# Patient Record
Sex: Female | Born: 2000 | Race: White | Hispanic: No | Marital: Single | Smoking: Current every day smoker
Health system: Southern US, Community
[De-identification: ages and names within clinical notes are randomized; demographics above are authoritative.]

## PROBLEM LIST (undated history)

## (undated) DIAGNOSIS — G43909 Migraine, unspecified, not intractable, without status migrainosus: Secondary | ICD-10-CM

---

## 2020-03-09 ENCOUNTER — Other Ambulatory Visit: Payer: Self-pay

## 2020-03-09 ENCOUNTER — Encounter (HOSPITAL_BASED_OUTPATIENT_CLINIC_OR_DEPARTMENT_OTHER): Payer: Self-pay

## 2020-03-09 ENCOUNTER — Emergency Department (HOSPITAL_BASED_OUTPATIENT_CLINIC_OR_DEPARTMENT_OTHER)
Admission: EM | Admit: 2020-03-09 | Discharge: 2020-03-10 | Disposition: A | Discharge status: Home / Self Care | Payer: No Typology Code available for payment source | Attending: Emergency Medicine | Admitting: Emergency Medicine

## 2020-03-09 ENCOUNTER — Emergency Department (HOSPITAL_BASED_OUTPATIENT_CLINIC_OR_DEPARTMENT_OTHER): Payer: No Typology Code available for payment source

## 2020-03-09 DIAGNOSIS — R002 Palpitations: Secondary | ICD-10-CM | POA: Diagnosis not present

## 2020-03-09 DIAGNOSIS — R0789 Other chest pain: Secondary | ICD-10-CM | POA: Diagnosis present

## 2020-03-09 DIAGNOSIS — Z8616 Personal history of COVID-19: Secondary | ICD-10-CM | POA: Diagnosis not present

## 2020-03-09 DIAGNOSIS — M791 Myalgia, unspecified site: Secondary | ICD-10-CM

## 2020-03-09 DIAGNOSIS — M79604 Pain in right leg: Secondary | ICD-10-CM | POA: Diagnosis not present

## 2020-03-09 DIAGNOSIS — M25512 Pain in left shoulder: Secondary | ICD-10-CM | POA: Diagnosis not present

## 2020-03-09 DIAGNOSIS — M79605 Pain in left leg: Secondary | ICD-10-CM | POA: Insufficient documentation

## 2020-03-09 DIAGNOSIS — F17290 Nicotine dependence, other tobacco product, uncomplicated: Secondary | ICD-10-CM | POA: Insufficient documentation

## 2020-03-09 LAB — CBC WITH DIFFERENTIAL/PLATELET
Abs Immature Granulocytes: 0.02 10*3/uL (ref 0.00–0.07)
Basophils Absolute: 0 10*3/uL (ref 0.0–0.1)
Basophils Relative: 0 %
Eosinophils Absolute: 0 10*3/uL (ref 0.0–0.5)
Eosinophils Relative: 0 %
HCT: 37.5 % (ref 36.0–46.0)
Hemoglobin: 12.6 g/dL (ref 12.0–15.0)
Immature Granulocytes: 0 %
Lymphocytes Relative: 18 %
Lymphs Abs: 1.6 10*3/uL (ref 0.7–4.0)
MCH: 29.7 pg (ref 26.0–34.0)
MCHC: 33.6 g/dL (ref 30.0–36.0)
MCV: 88.4 fL (ref 80.0–100.0)
Monocytes Absolute: 1.2 10*3/uL — ABNORMAL HIGH (ref 0.1–1.0)
Monocytes Relative: 13 %
Neutro Abs: 6.1 10*3/uL (ref 1.7–7.7)
Neutrophils Relative %: 69 %
Platelets: 329 10*3/uL (ref 150–400)
RBC: 4.24 MIL/uL (ref 3.87–5.11)
RDW: 12.4 % (ref 11.5–15.5)
WBC: 8.9 10*3/uL (ref 4.0–10.5)
nRBC: 0 % (ref 0.0–0.2)

## 2020-03-09 LAB — BASIC METABOLIC PANEL
Anion gap: 9 (ref 5–15)
BUN: 7 mg/dL (ref 6–20)
CO2: 27 mmol/L (ref 22–32)
Calcium: 9.7 mg/dL (ref 8.9–10.3)
Chloride: 103 mmol/L (ref 98–111)
Creatinine, Ser: 0.59 mg/dL (ref 0.44–1.00)
GFR, Estimated: >60 mL/min (ref >60)
Glucose, Bld: 91 mg/dL (ref 70–99)
Potassium: 3.6 mmol/L (ref 3.5–5.1)
Sodium: 139 mmol/L (ref 135–145)

## 2020-03-09 LAB — PREGNANCY, URINE: Preg Test, Ur: NEGATIVE

## 2020-03-09 MED ORDER — KETOROLAC TROMETHAMINE 15 MG/ML IJ SOLN
15.0000 mg | Freq: Once | INTRAMUSCULAR | Status: DC
  Filled 2020-03-09: qty 1

## 2020-03-09 MED ORDER — KETOROLAC TROMETHAMINE 15 MG/ML IJ SOLN
15.0000 mg | Freq: Once | INTRAMUSCULAR | Status: AC
  Administered 2020-03-09: 15 mg via INTRAVENOUS

## 2020-03-09 NOTE — ED Notes (Signed)
ED Provider at bedside. 

## 2020-03-09 NOTE — ED Notes (Signed)
Patient presented with PO Fluids as requested. Permission given by Wilkie Aye, MD.

## 2020-03-09 NOTE — ED Triage Notes (Addendum)
Pt c/o HA, body aches x today-c/o cough x 1 month-pain worse with deep breaths-tested +covid 1/24-NAD-steady gait

## 2020-03-10 LAB — D-DIMER, QUANTITATIVE: D-Dimer, Quant: <0.27 ug/mL-FEU (ref 0.00–0.50)

## 2020-03-10 NOTE — ED Provider Notes (Signed)
[facility] EMERGENCY DEPARTMENT Provider Note   CSN: [id number] Arrival date & time: 03/09/20  2224     History Chief Complaint  Patient presents with  . Generalized Body Aches    Kenedy Haisley is a 20 y.o. female.  HPI     This is a 20 year old female who presents with several complaints.  Patient reports that she had a significant amount of caffeine today including a highly caffeinated beverage from [employer] and a monster energy drink.  Since that time she has developed some left-sided chest and shoulder discomfort that is nonradiating.  She reports that it is pressure.  Rates her pain at 5 out of 10.  It is worse with deep breathing.  At one point she felt like she needed help holding of her left arm.  She describes associated palpitations.  No shortness of breath.  She also states that she had "achiness in my legs."  No recent fevers, chills, cough.  She was tested positive for COVID-19 on 1/28.    History reviewed. No pertinent past medical history.  There are no problems to display for this patient.   History reviewed. No pertinent surgical history.   OB History   No obstetric history on file.     No family history on file.  Social History   Tobacco Use  . Smoking status: Current Every Day Smoker    Types: E-cigarettes  . Smokeless tobacco: Never Used  Vaping Use  . Vaping Use: Every day  Substance Use Topics  . Alcohol use: Yes    Comment: weekly  . Drug use: Never    Home Medications Prior to Admission medications   Not on File    Allergies    Patient has no known allergies.  Review of Systems   Review of Systems  Constitutional: Negative for fever.  Respiratory: Negative for cough and shortness of breath.   Cardiovascular: Positive for chest pain and palpitations. Negative for leg swelling.  Gastrointestinal: Negative for abdominal pain, nausea and vomiting.  Neurological: Positive for headaches.  All other systems reviewed and are  negative.   Physical Exam Updated Vital Signs BP (!) 131/91   Pulse 64   Temp 98.4 F (36.9 C) (Oral)   Resp 16   Ht 1.778 m (5\' 10" )   Wt 54.4 kg   LMP 02/24/2020   SpO2 100%   BMI 17.22 kg/m   Physical Exam Vitals and nursing note reviewed.  Constitutional:      Appearance: She is well-developed and well-nourished. She is not ill-appearing.  HENT:     Head: Normocephalic and atraumatic.  Eyes:     Pupils: Pupils are equal, round, and reactive to light.  Cardiovascular:     Rate and Rhythm: Normal rate and regular rhythm.     Heart sounds: Normal heart sounds.  Pulmonary:     Effort: Pulmonary effort is normal. No respiratory distress.     Breath sounds: No wheezing.  Abdominal:     General: Bowel sounds are normal.     Palpations: Abdomen is soft.  Musculoskeletal:     Cervical back: Neck supple.     Right lower leg: No edema.     Left lower leg: No edema.  Skin:    General: Skin is warm and dry.  Neurological:     Mental Status: She is alert and oriented to person, place, and time.  Psychiatric:        Mood and Affect: Mood and affect and  mood normal.     ED Results / Procedures / Treatments   Labs (all labs ordered are listed, but only abnormal results are displayed) Labs Reviewed  CBC WITH DIFFERENTIAL/PLATELET - Abnormal; Notable for the following components:      Result Value   Monocytes Absolute 1.2 (*)    All other components within normal limits  BASIC METABOLIC PANEL  D-DIMER, QUANTITATIVE  PREGNANCY, URINE    EKG None ED ECG REPORT   Date: 03/10/2020  Rate: 78  Rhythm: normal sinus rhythm  QRS Axis: normal  Intervals: PR shortened  ST/T Wave abnormalities: normal  Conduction Disutrbances:none  Narrative Interpretation:   Old EKG Reviewed: none available  I have personally reviewed the EKG tracing and agree with the computerized printout as noted.  Radiology DG Chest Portable 1 View  Result Date: 03/09/2020 CLINICAL DATA:  Left  chest pain. Body aches. Cough. Positive COVID on 02/15/2020 EXAM: PORTABLE CHEST 1 VIEW COMPARISON:  None. FINDINGS: The heart size and mediastinal contours are within normal limits. Round density overlying the left lower lobe suggestive of a nipple shadow. Trace biapical pleural/pulmonary scarring. Large inspiratory effort. No focal consolidation. No pulmonary edema. No pleural effusion. No pneumothorax. No acute osseous abnormality. IMPRESSION: No active disease. Electronically Signed   By: Tish Frederickson M.D.   On: 03/09/2020 23:27    Procedures Procedures   Medications Ordered in ED Medications  ketorolac (TORADOL) 15 MG/ML injection 15 mg (15 mg Intravenous Given 03/09/20 2327)    ED Course  I have reviewed the triage vital signs and the nursing notes.  Pertinent labs & imaging results that were available during my care of the patient were reviewed by me and considered in my medical decision making (see chart for details).    MDM Rules/Calculators/A&P                          This is a 20 year old otherwise healthy female who presents with atypical chest pain and myalgias.  She is overall nontoxic and vital signs are reassuring.  Recently diagnosed with COVID-19.  She is very low risk for ACS.  EKG without acute ischemic changes.  Chest x-ray without evidence of pneumothorax or pneumonia.  Given recent COVID-19 diagnosis, will be at increased risk for hypercoagulable state.  Will screen with D-dimer for PE.  She is otherwise low risk.  D-dimer is negative.  Doubt PE.  Lab work including CBC and BMP reviewed.  No significant metabolic derangements.  Work-up is very reassuring.  Doubt acute emergent process  After history, exam, and medical workup I feel the patient has been appropriately medically screened and is safe for discharge home. Pertinent diagnoses were discussed with the patient. Patient was given return precautions.  Final Clinical Impression(s) / ED Diagnoses Final diagnoses:   Atypical chest pain  Myalgia    Rx / DC Orders ED Discharge Orders    None       Anika Shore, Mayer Masker, MD 03/10/20 0041

## 2020-03-10 NOTE — Discharge Instructions (Signed)
You were seen today for several complaints including muscle pain and chest pain.  Your work-up is reassuring.  There is not appear to be acute emergent process.  Take Tylenol or ibuprofen as needed for pain.  Avoid excessive caffeine use if you feel that this is related to your symptoms.

## 2020-12-25 ENCOUNTER — Encounter (HOSPITAL_BASED_OUTPATIENT_CLINIC_OR_DEPARTMENT_OTHER): Payer: Self-pay | Admitting: *Deleted

## 2020-12-25 ENCOUNTER — Emergency Department (HOSPITAL_BASED_OUTPATIENT_CLINIC_OR_DEPARTMENT_OTHER)
Admission: EM | Admit: 2020-12-25 | Discharge: 2020-12-25 | Disposition: A | Discharge status: Home / Self Care | Payer: No Typology Code available for payment source | Attending: Emergency Medicine | Admitting: Emergency Medicine

## 2020-12-25 ENCOUNTER — Other Ambulatory Visit: Payer: Self-pay

## 2020-12-25 DIAGNOSIS — J101 Influenza due to other identified influenza virus with other respiratory manifestations: Secondary | ICD-10-CM | POA: Insufficient documentation

## 2020-12-25 DIAGNOSIS — F17210 Nicotine dependence, cigarettes, uncomplicated: Secondary | ICD-10-CM | POA: Diagnosis not present

## 2020-12-25 DIAGNOSIS — R509 Fever, unspecified: Secondary | ICD-10-CM | POA: Diagnosis present

## 2020-12-25 DIAGNOSIS — J111 Influenza due to unidentified influenza virus with other respiratory manifestations: Secondary | ICD-10-CM

## 2020-12-25 DIAGNOSIS — Z20822 Contact with and (suspected) exposure to covid-19: Secondary | ICD-10-CM | POA: Diagnosis not present

## 2020-12-25 DIAGNOSIS — R110 Nausea: Secondary | ICD-10-CM

## 2020-12-25 LAB — CBC WITH DIFFERENTIAL/PLATELET
Abs Immature Granulocytes: 0.01 10*3/uL (ref 0.00–0.07)
Basophils Absolute: 0 10*3/uL (ref 0.0–0.1)
Basophils Relative: 1 %
Eosinophils Absolute: 0 10*3/uL (ref 0.0–0.5)
Eosinophils Relative: 0 %
HCT: 31.4 % — ABNORMAL LOW (ref 36.0–46.0)
Hemoglobin: 10.7 g/dL — ABNORMAL LOW (ref 12.0–15.0)
Immature Granulocytes: 1 %
Lymphocytes Relative: 16 %
Lymphs Abs: 0.3 10*3/uL — ABNORMAL LOW (ref 0.7–4.0)
MCH: 29.7 pg (ref 26.0–34.0)
MCHC: 34.1 g/dL (ref 30.0–36.0)
MCV: 87.2 fL (ref 80.0–100.0)
Monocytes Absolute: 0.5 10*3/uL (ref 0.1–1.0)
Monocytes Relative: 27 %
Neutro Abs: 1.1 10*3/uL — ABNORMAL LOW (ref 1.7–7.7)
Neutrophils Relative %: 55 %
Platelets: 176 10*3/uL (ref 150–400)
RBC: 3.6 MIL/uL — ABNORMAL LOW (ref 3.87–5.11)
RDW: 12.4 % (ref 11.5–15.5)
WBC: 2 10*3/uL — ABNORMAL LOW (ref 4.0–10.5)
nRBC: 0 % (ref 0.0–0.2)

## 2020-12-25 LAB — RESP PANEL BY RT-PCR (FLU A&B, COVID) ARPGX2
Influenza A by PCR: POSITIVE — AB
Influenza B by PCR: NEGATIVE
SARS Coronavirus 2 by RT PCR: NEGATIVE

## 2020-12-25 LAB — URINALYSIS, ROUTINE W REFLEX MICROSCOPIC
Bilirubin Urine: NEGATIVE
Glucose, UA: NEGATIVE mg/dL
Hgb urine dipstick: NEGATIVE
Ketones, ur: NEGATIVE mg/dL
Leukocytes,Ua: NEGATIVE
Nitrite: NEGATIVE
Protein, ur: NEGATIVE mg/dL
Specific Gravity, Urine: 1.01 (ref 1.005–1.030)
pH: 6.5 (ref 5.0–8.0)

## 2020-12-25 LAB — COMPREHENSIVE METABOLIC PANEL
ALT: 12 U/L (ref 0–44)
AST: 20 U/L (ref 15–41)
Albumin: 4 g/dL (ref 3.5–5.0)
Alkaline Phosphatase: 39 U/L (ref 38–126)
Anion gap: 10 (ref 5–15)
BUN: <5 mg/dL — ABNORMAL LOW (ref 6–20)
CO2: 23 mmol/L (ref 22–32)
Calcium: 8.8 mg/dL — ABNORMAL LOW (ref 8.9–10.3)
Chloride: 100 mmol/L (ref 98–111)
Creatinine, Ser: 0.53 mg/dL (ref 0.44–1.00)
GFR, Estimated: >60 mL/min (ref >60)
Glucose, Bld: 124 mg/dL — ABNORMAL HIGH (ref 70–99)
Potassium: 3.1 mmol/L — ABNORMAL LOW (ref 3.5–5.1)
Sodium: 133 mmol/L — ABNORMAL LOW (ref 135–145)
Total Bilirubin: 0.5 mg/dL (ref 0.3–1.2)
Total Protein: 6.5 g/dL (ref 6.5–8.1)

## 2020-12-25 LAB — PREGNANCY, URINE: Preg Test, Ur: NEGATIVE

## 2020-12-25 MED ORDER — KETOROLAC TROMETHAMINE 30 MG/ML IJ SOLN
30.0000 mg | Freq: Once | INTRAMUSCULAR | Status: AC
  Administered 2020-12-25: 18:00:00 30 mg via INTRAVENOUS
  Filled 2020-12-25: qty 1

## 2020-12-25 MED ORDER — LACTATED RINGERS IV BOLUS
1000.0000 mL | Freq: Once | INTRAVENOUS | Status: AC
  Administered 2020-12-25: 18:00:00 1000 mL via INTRAVENOUS

## 2020-12-25 MED ORDER — POTASSIUM CHLORIDE CRYS ER 20 MEQ PO TBCR
40.0000 meq | EXTENDED_RELEASE_TABLET | Freq: Once | ORAL | Status: AC
  Administered 2020-12-25: 20:00:00 40 meq via ORAL
  Filled 2020-12-25: qty 2

## 2020-12-25 MED ORDER — ONDANSETRON HCL 4 MG/2ML IJ SOLN
4.0000 mg | Freq: Once | INTRAMUSCULAR | Status: AC
  Administered 2020-12-25: 18:00:00 4 mg via INTRAVENOUS
  Filled 2020-12-25: qty 2

## 2020-12-25 MED ORDER — ACETAMINOPHEN 500 MG PO TABS
1000.0000 mg | ORAL_TABLET | Freq: Once | ORAL | Status: AC
  Administered 2020-12-25: 17:00:00 1000 mg via ORAL
  Filled 2020-12-25: qty 2

## 2020-12-25 MED ORDER — ONDANSETRON 4 MG PO TBDP
4.0000 mg | ORAL_TABLET | Freq: Once | ORAL | Status: AC
  Administered 2020-12-25: 17:00:00 4 mg via ORAL
  Filled 2020-12-25: qty 1

## 2020-12-25 MED ORDER — ONDANSETRON HCL 4 MG PO TABS: 4.0000 mg | ORAL_TABLET | Freq: Four times a day (QID) | ORAL | 0 refills | Status: AC

## 2020-12-25 NOTE — ED Provider Notes (Signed)
[facility] EMERGENCY DEPARTMENT Provider Note   CSN: [id number] Arrival date & time: 12/25/20  1651     History Chief Complaint  Patient presents with   Fever    Lori Wilkerson is a 20 y.o. female.  HPI     Chills, aches, nausea Can't eat or drink Severe aches and chills Haven't thrown up but having severe nausea so has not been eating or drinking starting around 5PM No diarrhea but has not had anything to eat Coughing a little bit but not too bad No shortness of breath Fatigue, lightheadedness when going from sitting to standing  No urinary symptoms, no preg symptoms Some abdominal pain but feels like hunger, not constant Headache on and off  No chest pain  No congestion or sore throat Had virtual visit and was diagnosed clinically with flu J and J in 2021, no flu shot  No other medical problems, no cig, vape for 2 yr, no other drugs Etoh on weekends 1-2    History reviewed. No pertinent past medical history.  There are no problems to display for this patient.   History reviewed. No pertinent surgical history.   OB History   No obstetric history on file.     History reviewed. No pertinent family history.  Social History   Tobacco Use   Smoking status: Every Day    Types: E-cigarettes   Smokeless tobacco: Never  Vaping Use   Vaping Use: Every day  Substance Use Topics   Alcohol use: Yes    Comment: weekly   Drug use: Never    Home Medications Prior to Admission medications   Medication Sig Start Date End Date Taking? Authorizing Provider  ondansetron (ZOFRAN) 4 MG tablet Take 1 tablet (4 mg total) by mouth every 6 (six) hours. 12/25/20  Yes Alvira Monday, MD    Allergies    Patient has no known allergies.  Review of Systems   Review of Systems  Constitutional:  Positive for appetite change, fatigue and fever.  HENT:  Negative for congestion and sore throat.   Eyes:  Negative for visual disturbance.  Respiratory:  Positive  for cough. Negative for shortness of breath.   Cardiovascular:  Negative for chest pain.  Gastrointestinal:  Positive for nausea and vomiting. Negative for abdominal pain and diarrhea.  Genitourinary:  Negative for difficulty urinating.  Musculoskeletal:  Positive for arthralgias and myalgias. Negative for back pain and neck pain.  Skin:  Negative for rash.  Neurological:  Positive for headaches. Negative for syncope.   Physical Exam Updated Vital Signs BP 121/63 (BP Location: Right Arm)   Pulse 73   Temp 98.5 F (36.9 C) (Oral)   Resp 16   Ht 5\' 10"  (1.778 m)   Wt 52.2 kg   LMP 12/13/2020   SpO2 98%   BMI 16.50 kg/m   Physical Exam Vitals and nursing note reviewed.  Constitutional:      General: She is not in acute distress.    Appearance: She is well-developed. She is not diaphoretic.  HENT:     Head: Normocephalic and atraumatic.  Eyes:     Conjunctiva/sclera: Conjunctivae normal.  Cardiovascular:     Rate and Rhythm: Regular rhythm. Tachycardia present.     Heart sounds: Normal heart sounds. No murmur heard.   No friction rub. No gallop.  Pulmonary:     Effort: Pulmonary effort is normal. No respiratory distress.     Breath sounds: Normal breath sounds. No wheezing or rales.  Abdominal:     General: There is no distension.     Palpations: Abdomen is soft.     Tenderness: There is no abdominal tenderness. There is no guarding.  Musculoskeletal:        General: No tenderness.     Cervical back: Normal range of motion.  Skin:    General: Skin is warm and dry.     Findings: No erythema or rash.  Neurological:     Mental Status: She is alert and oriented to person, place, and time.    ED Results / Procedures / Treatments   Labs (all labs ordered are listed, but only abnormal results are displayed) Labs Reviewed  RESP PANEL BY RT-PCR (FLU A&B, COVID) ARPGX2 - Abnormal; Notable for the following components:      Result Value   Influenza A by PCR POSITIVE (*)     All other components within normal limits  URINALYSIS, ROUTINE W REFLEX MICROSCOPIC - Abnormal; Notable for the following components:   Color, Urine STRAW (*)    All other components within normal limits  CBC WITH DIFFERENTIAL/PLATELET - Abnormal; Notable for the following components:   WBC 2.0 (*)    RBC 3.60 (*)    Hemoglobin 10.7 (*)    HCT 31.4 (*)    Neutro Abs 1.1 (*)    Lymphs Abs 0.3 (*)    All other components within normal limits  COMPREHENSIVE METABOLIC PANEL - Abnormal; Notable for the following components:   Sodium 133 (*)    Potassium 3.1 (*)    Glucose, Bld 124 (*)    BUN <5 (*)    Calcium 8.8 (*)    All other components within normal limits  PREGNANCY, URINE    EKG None  Radiology No results found.  Procedures Procedures   Medications Ordered in ED Medications  acetaminophen (TYLENOL) tablet 1,000 mg (1,000 mg Oral Given 12/25/20 1712)  ondansetron (ZOFRAN-ODT) disintegrating tablet 4 mg (4 mg Oral Given 12/25/20 1712)  lactated ringers bolus 1,000 mL (0 mLs Intravenous Stopped 12/25/20 1912)  ketorolac (TORADOL) 30 MG/ML injection 30 mg (30 mg Intravenous Given 12/25/20 1807)  ondansetron (ZOFRAN) injection 4 mg (4 mg Intravenous Given 12/25/20 1807)  potassium chloride SA (KLOR-CON M) CR tablet 40 mEq (40 mEq Oral Given 12/25/20 1948)    ED Course  I have reviewed the triage vital signs and the nursing notes.  Pertinent labs & imaging results that were available during my care of the patient were reviewed by me and considered in my medical decision making (see chart for details).    MDM Rules/Calculators/A&P                           20 year old female with no significant medical history presents with concern for fever, body aches, nausea and fatigue beginning yesterday.  She was diagnosed clinically with the flu and given a prescription for Tamiflu via telehealth.  Urine pregnancy negative.  Urinalysis shows no sign of UTI.  Benign abdominal exam and  doubt intra-abdominal etiology of symptoms.  Reports only slight cough, with normal lung exam and have low suspicion for bacterial pneumonia.  Given her inability to tolerate p.o. today, generalized weakness, lightheadedness, ordered labs, IV fluids, and IV Zofran.  Labs show leukopenia/ANA 1100, discussed with Dr. Alvy Bimler Hematology, likely viral supression/acute illness. Recommended recheck of labs with PCP, if no improvement in one month can follow up with hematology.  Mild hypokalemia-given dose of  K in ED.    COVID flu testing positive for influenza.  Feels improved with hydration, IV zofran. Given rx for zofran, recommend continued supportive care. She received tamilfu from urgent care.   Final Clinical Impression(s) / ED Diagnoses Final diagnoses:  Fever, unspecified fever cause  Nausea  Influenza    Rx / DC Orders ED Discharge Orders          Ordered    ondansetron (ZOFRAN) 4 MG tablet  Every 6 hours        12/25/20 1855             Gareth Morgan, MD 12/26/20 1414

## 2020-12-25 NOTE — ED Notes (Signed)
Patient discharged to home.  All discharge instructions reviewed.  Patient verbalized understanding via teachback method.  VS WDL.  Respirations even and unlabored.  Ambulatory out of ED.   °

## 2020-12-25 NOTE — ED Triage Notes (Addendum)
Fever, body aches, nausea, decreased appetite x 1 day. She took tamiflu earlier today- was dx with flu via virtual appt. States she has taken dramamine for nausea

## 2020-12-30 ENCOUNTER — Emergency Department (HOSPITAL_BASED_OUTPATIENT_CLINIC_OR_DEPARTMENT_OTHER)
Admission: EM | Admit: 2020-12-30 | Discharge: 2020-12-30 | Disposition: A | Discharge status: Home / Self Care | Payer: No Typology Code available for payment source | Attending: Student | Admitting: Student

## 2020-12-30 ENCOUNTER — Other Ambulatory Visit: Payer: Self-pay

## 2020-12-30 ENCOUNTER — Encounter (HOSPITAL_BASED_OUTPATIENT_CLINIC_OR_DEPARTMENT_OTHER): Payer: Self-pay

## 2020-12-30 DIAGNOSIS — R112 Nausea with vomiting, unspecified: Secondary | ICD-10-CM

## 2020-12-30 DIAGNOSIS — J111 Influenza due to unidentified influenza virus with other respiratory manifestations: Secondary | ICD-10-CM | POA: Insufficient documentation

## 2020-12-30 DIAGNOSIS — F17290 Nicotine dependence, other tobacco product, uncomplicated: Secondary | ICD-10-CM | POA: Diagnosis not present

## 2020-12-30 DIAGNOSIS — R519 Headache, unspecified: Secondary | ICD-10-CM | POA: Diagnosis present

## 2020-12-30 DIAGNOSIS — R7401 Elevation of levels of liver transaminase levels: Secondary | ICD-10-CM | POA: Insufficient documentation

## 2020-12-30 DIAGNOSIS — D72819 Decreased white blood cell count, unspecified: Secondary | ICD-10-CM | POA: Insufficient documentation

## 2020-12-30 LAB — CBC WITH DIFFERENTIAL/PLATELET
Abs Immature Granulocytes: 0 10*3/uL (ref 0.00–0.07)
Basophils Absolute: 0 10*3/uL (ref 0.0–0.1)
Basophils Relative: 0 %
Eosinophils Absolute: 0 10*3/uL (ref 0.0–0.5)
Eosinophils Relative: 0 %
HCT: 34.5 % — ABNORMAL LOW (ref 36.0–46.0)
Hemoglobin: 12.1 g/dL (ref 12.0–15.0)
Immature Granulocytes: 0 %
Lymphocytes Relative: 25 %
Lymphs Abs: 1.2 10*3/uL (ref 0.7–4.0)
MCH: 30.3 pg (ref 26.0–34.0)
MCHC: 35.1 g/dL (ref 30.0–36.0)
MCV: 86.5 fL (ref 80.0–100.0)
Monocytes Absolute: 0.5 10*3/uL (ref 0.1–1.0)
Monocytes Relative: 10 %
Neutro Abs: 3.1 10*3/uL (ref 1.7–7.7)
Neutrophils Relative %: 65 %
Platelets: 149 10*3/uL — ABNORMAL LOW (ref 150–400)
RBC: 3.99 MIL/uL (ref 3.87–5.11)
RDW: 12.3 % (ref 11.5–15.5)
WBC: 4.8 10*3/uL (ref 4.0–10.5)
nRBC: 0 % (ref 0.0–0.2)

## 2020-12-30 LAB — COMPREHENSIVE METABOLIC PANEL
ALT: 71 U/L — ABNORMAL HIGH (ref 0–44)
AST: 132 U/L — ABNORMAL HIGH (ref 15–41)
Albumin: 3.9 g/dL (ref 3.5–5.0)
Alkaline Phosphatase: 36 U/L — ABNORMAL LOW (ref 38–126)
Anion gap: 7 (ref 5–15)
BUN: 6 mg/dL (ref 6–20)
CO2: 27 mmol/L (ref 22–32)
Calcium: 8.6 mg/dL — ABNORMAL LOW (ref 8.9–10.3)
Chloride: 104 mmol/L (ref 98–111)
Creatinine, Ser: 0.49 mg/dL (ref 0.44–1.00)
GFR, Estimated: >60 mL/min (ref >60)
Glucose, Bld: 109 mg/dL — ABNORMAL HIGH (ref 70–99)
Potassium: 3.7 mmol/L (ref 3.5–5.1)
Sodium: 138 mmol/L (ref 135–145)
Total Bilirubin: 0.3 mg/dL (ref 0.3–1.2)
Total Protein: 6.7 g/dL (ref 6.5–8.1)

## 2020-12-30 MED ORDER — ONDANSETRON HCL 4 MG/2ML IJ SOLN: 4.0000 mg | Freq: Once | INTRAMUSCULAR | Status: DC

## 2020-12-30 MED ORDER — LACTATED RINGERS IV BOLUS
1000.0000 mL | Freq: Once | INTRAVENOUS | Status: AC
  Administered 2020-12-30: 1000 mL via INTRAVENOUS

## 2020-12-30 MED ORDER — DIPHENHYDRAMINE HCL 50 MG/ML IJ SOLN
25.0000 mg | Freq: Once | INTRAMUSCULAR | Status: AC
  Administered 2020-12-30: 25 mg via INTRAVENOUS
  Filled 2020-12-30: qty 1

## 2020-12-30 MED ORDER — PROCHLORPERAZINE EDISYLATE 10 MG/2ML IJ SOLN
10.0000 mg | Freq: Once | INTRAMUSCULAR | Status: AC
  Administered 2020-12-30: 10 mg via INTRAVENOUS
  Filled 2020-12-30: qty 2

## 2020-12-30 MED ORDER — KETOROLAC TROMETHAMINE 15 MG/ML IJ SOLN
15.0000 mg | Freq: Once | INTRAMUSCULAR | Status: AC
  Administered 2020-12-30: 15 mg via INTRAVENOUS
  Filled 2020-12-30: qty 1

## 2020-12-30 NOTE — ED Notes (Signed)
ED Provider at bedside. 

## 2020-12-30 NOTE — Discharge Instructions (Addendum)
You were seen today in the emergency department for evaluation of nausea vomiting and headache in the setting of a known influenza diagnosis..  You are given a nausea medication here in the emergency department that improved your symptoms and you are safe for discharge today.  Your blood work shows an improving white blood cell count that was previously low, your electrolytes are normal but you do have elevated liver function tests with an AST of 132, ALT 71.  These are likely elevated in the setting of your persistent vomiting and these usually improve on their own.  Please call your primary care physician at home to schedule follow-up blood work to ensure that these are improving.  At this time you are safe for discharge, but please return the emergency department if your vomiting is uncontrolled, you  had chest pain, shortness of breath, persistent fever or any other concerning symptoms.

## 2020-12-30 NOTE — ED Notes (Signed)
No acute distress noted upon this RN's departure of patient. Verified discharge paperwork with name and DOB. Vital signs stable. Patient taken to checkout window. Discharge paperwork discussed with patient. No further questions voiced upon discharge.  ° °

## 2020-12-30 NOTE — ED Provider Notes (Signed)
[facility] EMERGENCY DEPARTMENT Provider Note   CSN: [id number] Arrival date & time: 12/30/20  [id number]     History Chief Complaint  Patient presents with   Emesis    Lori Wilkerson is a 20 y.o. female who presents to the emergency department for evaluation of emesis and headache.  Patient was diagnosed with flu on 12/25/2020 and discharged with a prescription for Tamiflu.  She has since completed the Tamiflu but has had persistent emesis and arrives with a headache.  She was prescribed Zofran but has not taken this medication due to fears of QT prolongation interaction with her Zoloft.  Previous ECGs show no evidence of QT prolongation.  Denies abdominal pain, chest pain, shortness of breath or other systemic symptoms.  4 days ago, pregnancy test negative and patient has had no vaginal bleeding or discharge, no sexual activity since then.   Emesis Associated symptoms: headaches   Associated symptoms: no abdominal pain, no arthralgias, no chills, no cough, no fever and no sore throat       History reviewed. No pertinent past medical history.  There are no problems to display for this patient.   History reviewed. No pertinent surgical history.   OB History   No obstetric history on file.     No family history on file.  Social History   Tobacco Use   Smoking status: Every Day    Types: E-cigarettes   Smokeless tobacco: Never  Vaping Use   Vaping Use: Every day  Substance Use Topics   Alcohol use: Yes    Comment: weekly   Drug use: Yes    Types: Marijuana    Home Medications Prior to Admission medications   Medication Sig Start Date End Date Taking? Authorizing Provider  ondansetron (ZOFRAN) 4 MG tablet Take 1 tablet (4 mg total) by mouth every 6 (six) hours. 12/25/20   Gareth Morgan, MD    Allergies    Patient has no known allergies.  Review of Systems   Review of Systems  Constitutional:  Negative for chills and fever.  HENT:  Negative for ear  pain and sore throat.   Eyes:  Negative for pain and visual disturbance.  Respiratory:  Negative for cough and shortness of breath.   Cardiovascular:  Negative for chest pain and palpitations.  Gastrointestinal:  Positive for nausea and vomiting. Negative for abdominal pain.  Genitourinary:  Negative for dysuria and hematuria.  Musculoskeletal:  Negative for arthralgias and back pain.  Skin:  Negative for color change and rash.  Neurological:  Positive for headaches. Negative for seizures and syncope.  All other systems reviewed and are negative.  Physical Exam Updated Vital Signs BP 121/78   Pulse 74   Temp (!) 97.5 F (36.4 C) (Oral)   Resp 16   Ht 5\' 10"  (1.778 m)   Wt 52.2 kg   LMP 12/13/2020   SpO2 100%   BMI 16.50 kg/m   Physical Exam Vitals and nursing note reviewed.  Constitutional:      General: She is not in acute distress.    Appearance: She is well-developed.  HENT:     Head: Normocephalic and atraumatic.  Eyes:     Conjunctiva/sclera: Conjunctivae normal.  Cardiovascular:     Rate and Rhythm: Normal rate and regular rhythm.     Heart sounds: No murmur heard. Pulmonary:     Effort: Pulmonary effort is normal. No respiratory distress.     Breath sounds: Normal breath sounds.  Abdominal:  Palpations: Abdomen is soft.     Tenderness: There is no abdominal tenderness.  Musculoskeletal:        General: No swelling.     Cervical back: Neck supple.  Skin:    General: Skin is warm and dry.     Capillary Refill: Capillary refill takes less than 2 seconds.  Neurological:     Mental Status: She is alert.  Psychiatric:        Mood and Affect: Mood normal.    ED Results / Procedures / Treatments   Labs (all labs ordered are listed, but only abnormal results are displayed) Labs Reviewed  COMPREHENSIVE METABOLIC PANEL - Abnormal; Notable for the following components:      Result Value   Glucose, Bld 109 (*)    Calcium 8.6 (*)    AST 132 (*)    ALT 71  (*)    Alkaline Phosphatase 36 (*)    All other components within normal limits  CBC WITH DIFFERENTIAL/PLATELET - Abnormal; Notable for the following components:   HCT 34.5 (*)    Platelets 149 (*)    All other components within normal limits    EKG None  Radiology No results found.  Procedures Procedures   Medications Ordered in ED Medications  lactated ringers bolus 1,000 mL (1,000 mLs Intravenous New Bag/Given 12/30/20 0825)  prochlorperazine (COMPAZINE) injection 10 mg (10 mg Intravenous Given 12/30/20 0822)  diphenhydrAMINE (BENADRYL) injection 25 mg (25 mg Intravenous Given 12/30/20 0826)  ketorolac (TORADOL) 15 MG/ML injection 15 mg (15 mg Intravenous Given 12/30/20 0831)    ED Course  I have reviewed the triage vital signs and the nursing notes.  Pertinent labs & imaging results that were available during my care of the patient were reviewed by me and considered in my medical decision making (see chart for details).    MDM Rules/Calculators/A&P                           Patient seen emergency department for evaluation of the headache nausea and vomiting in the setting of a influenza infection.  Physical exam is unremarkable with a benign abdominal exam, cardiopulmonary exam unremarkable.  Laboratory evaluation with an improving white blood cell count at 4.8 up from leukopenia 2.05 days ago.  Chemistry unremarkable.  Patient does have new transaminitis to AST 132, ALT 71 but she has no right upper quadrant pain or postprandial symptoms.  Suspect persistent vomiting versus medication side effect from Tamiflu causing transient transaminitis.  Patient given headache cocktail and fluid resuscitation here in the emergency department and symptoms are much improved on reevaluation.  Patient is going home from college on Thursday and will set up an appointment with her PCP back home to follow-up on the transaminitis to ensure that this is resolving.  She was given strict return  precautions which she voiced understanding and she was discharged. Final Clinical Impression(s) / ED Diagnoses Final diagnoses:  Nausea and vomiting, unspecified vomiting type  Transaminitis  Influenza    Rx / DC Orders ED Discharge Orders     None        Pharaoh Pio, Wyn Forster, MD 12/30/20 [phone]

## 2020-12-30 NOTE — ED Triage Notes (Addendum)
Pt arrives to ED following recent Flu diagnosis on Sunday with reports of vomiting and headache starting this morning. Pt was given tamiflu which she finished last night and given Zofran that she did not take for concern of interaction with her Zoloft.  Pt also states she recently quit vapping and ' wondered if this was a bad nicotine headache'

## 2021-09-16 ENCOUNTER — Encounter (HOSPITAL_BASED_OUTPATIENT_CLINIC_OR_DEPARTMENT_OTHER): Payer: Self-pay | Admitting: Emergency Medicine

## 2021-09-16 ENCOUNTER — Emergency Department (HOSPITAL_BASED_OUTPATIENT_CLINIC_OR_DEPARTMENT_OTHER)
Admission: EM | Admit: 2021-09-16 | Discharge: 2021-09-17 | Disposition: A | Discharge status: Home / Self Care | Payer: No Typology Code available for payment source | Attending: Emergency Medicine | Admitting: Emergency Medicine

## 2021-09-16 DIAGNOSIS — R112 Nausea with vomiting, unspecified: Secondary | ICD-10-CM | POA: Diagnosis not present

## 2021-09-16 DIAGNOSIS — R519 Headache, unspecified: Secondary | ICD-10-CM

## 2021-09-16 DIAGNOSIS — F17210 Nicotine dependence, cigarettes, uncomplicated: Secondary | ICD-10-CM | POA: Insufficient documentation

## 2021-09-16 HISTORY — DX: Migraine, unspecified, not intractable, without status migrainosus: G43.909

## 2021-09-16 LAB — CBC WITH DIFFERENTIAL/PLATELET
Abs Immature Granulocytes: 0.02 10*3/uL (ref 0.00–0.07)
Basophils Absolute: 0 10*3/uL (ref 0.0–0.1)
Basophils Relative: 1 %
Eosinophils Absolute: 0 10*3/uL (ref 0.0–0.5)
Eosinophils Relative: 1 %
HCT: 35.7 % — ABNORMAL LOW (ref 36.0–46.0)
Hemoglobin: 12.2 g/dL (ref 12.0–15.0)
Immature Granulocytes: 0 %
Lymphocytes Relative: 20 %
Lymphs Abs: 1.5 10*3/uL (ref 0.7–4.0)
MCH: 30.3 pg (ref 26.0–34.0)
MCHC: 34.2 g/dL (ref 30.0–36.0)
MCV: 88.6 fL (ref 80.0–100.0)
Monocytes Absolute: 0.4 10*3/uL (ref 0.1–1.0)
Monocytes Relative: 6 %
Neutro Abs: 5.5 10*3/uL (ref 1.7–7.7)
Neutrophils Relative %: 72 %
Platelets: 238 10*3/uL (ref 150–400)
RBC: 4.03 MIL/uL (ref 3.87–5.11)
RDW: 12.3 % (ref 11.5–15.5)
WBC: 7.5 10*3/uL (ref 4.0–10.5)
nRBC: 0 % (ref 0.0–0.2)

## 2021-09-16 LAB — BASIC METABOLIC PANEL
Anion gap: 9 (ref 5–15)
BUN: 8 mg/dL (ref 6–20)
CO2: 24 mmol/L (ref 22–32)
Calcium: 8.7 mg/dL — ABNORMAL LOW (ref 8.9–10.3)
Chloride: 108 mmol/L (ref 98–111)
Creatinine, Ser: 0.63 mg/dL (ref 0.44–1.00)
GFR, Estimated: >60 mL/min (ref >60)
Glucose, Bld: 100 mg/dL — ABNORMAL HIGH (ref 70–99)
Potassium: 3.3 mmol/L — ABNORMAL LOW (ref 3.5–5.1)
Sodium: 141 mmol/L (ref 135–145)

## 2021-09-16 LAB — HCG, SERUM, QUALITATIVE: Preg, Serum: NEGATIVE

## 2021-09-16 MED ORDER — DIPHENHYDRAMINE HCL 50 MG/ML IJ SOLN
25.0000 mg | Freq: Once | INTRAMUSCULAR | Status: AC
  Administered 2021-09-16: 25 mg via INTRAVENOUS
  Filled 2021-09-16: qty 1

## 2021-09-16 MED ORDER — METOCLOPRAMIDE HCL 5 MG/ML IJ SOLN
10.0000 mg | Freq: Once | INTRAMUSCULAR | Status: AC
  Administered 2021-09-16: 10 mg via INTRAVENOUS
  Filled 2021-09-16: qty 2

## 2021-09-16 MED ORDER — SODIUM CHLORIDE 0.9 % IV BOLUS
1000.0000 mL | Freq: Once | INTRAVENOUS | Status: AC
  Administered 2021-09-16: 1000 mL via INTRAVENOUS

## 2021-09-16 MED ORDER — KETOROLAC TROMETHAMINE 15 MG/ML IJ SOLN
15.0000 mg | Freq: Once | INTRAMUSCULAR | Status: AC | PRN
  Administered 2021-09-16: 15 mg via INTRAVENOUS
  Filled 2021-09-16: qty 1

## 2021-09-16 NOTE — ED Provider Notes (Signed)
[facility] Provider Note: Lori Dell, MD, FACEP  CSN: [id number] MRN: [id number] ARRIVAL: 09/16/21 at 2151 ROOM: [facility]   CHIEF COMPLAINT  Headache and Vomiting   HISTORY OF PRESENT ILLNESS  09/16/21 10:51 PM Lori Wilkerson is a 21 y.o. female who was at a college event today which involved heavy alcohol consumption.  She had not had any alcohol for most of the summer.  This ended about 3 PM.  About 8 PM she developed a frontal headache which she rates as a 7 out of 10.  She states she does have a history of migraines.  The headache has been associated with nausea and vomiting and she has not been able to keep anything down.   Past Medical History:  Diagnosis Date   Migraine     History reviewed. No pertinent surgical history.  No family history on file.  Social History   Tobacco Use   Smoking status: Every Day    Types: E-cigarettes   Smokeless tobacco: Never  Vaping Use   Vaping Use: Every day  Substance Use Topics   Alcohol use: Yes    Comment: weekly   Drug use: Yes    Types: Marijuana    Prior to Admission medications   Medication Sig Start Date End Date Taking? Authorizing Provider  ondansetron (ZOFRAN) 4 MG tablet Take 1 tablet (4 mg total) by mouth every 6 (six) hours. 12/25/20   Alvira Monday, MD    Allergies Patient has no known allergies.   REVIEW OF SYSTEMS  Negative except as noted here or in the History of Present Illness.   PHYSICAL EXAMINATION  Initial Vital Signs Blood pressure (!) 129/98, pulse 95, temperature 98 F (36.7 C), temperature source Oral, resp. rate 20, height 5\' 10"  (1.778 m), weight 52.2 kg, last menstrual period 09/10/2021, SpO2 99 %.  Examination General: Well-developed, well-nourished female in no acute distress; appearance consistent with age of record HENT: normocephalic; atraumatic Eyes: pupils equal, round and reactive to light; extraocular muscles intact Neck: supple Heart: regular rate and  rhythm Lungs: clear to auscultation bilaterally Abdomen: soft; nondistended; nontender; bowel sounds present Extremities: No deformity; full range of motion; pulses normal Neurologic: Awake, alert and oriented; motor function intact in all extremities and symmetric; no facial droop Skin: Warm and dry Psychiatric: Normal mood and affect   RESULTS  Summary of this visit's results, reviewed and interpreted by myself:   EKG Interpretation  Date/Time:    Ventricular Rate:    PR Interval:    QRS Duration:   QT Interval:    QTC Calculation:   R Axis:     Text Interpretation:         Laboratory Studies: Results for orders placed or performed during the hospital encounter of 09/16/21 (from the past 24 hour(s))  CBC with Differential     Status: Abnormal   Collection Time: 09/16/21 11:11 PM  Result Value Ref Range   WBC 7.5 4.0 - 10.5 K/uL   RBC 4.03 3.87 - 5.11 MIL/uL   Hemoglobin 12.2 12.0 - 15.0 g/dL   HCT 09/18/21 (L) 09.3 - 81.8 %   MCV 88.6 80.0 - 100.0 fL   MCH 30.3 26.0 - 34.0 pg   MCHC 34.2 30.0 - 36.0 g/dL   RDW 29.9 37.1 - 69.6 %   Platelets 238 150 - 400 K/uL   nRBC 0.0 0.0 - 0.2 %   Neutrophils Relative % 72 %   Neutro Abs 5.5 1.7 - 7.7  K/uL   Lymphocytes Relative 20 %   Lymphs Abs 1.5 0.7 - 4.0 K/uL   Monocytes Relative 6 %   Monocytes Absolute 0.4 0.1 - 1.0 K/uL   Eosinophils Relative 1 %   Eosinophils Absolute 0.0 0.0 - 0.5 K/uL   Basophils Relative 1 %   Basophils Absolute 0.0 0.0 - 0.1 K/uL   Immature Granulocytes 0 %   Abs Immature Granulocytes 0.02 0.00 - 0.07 K/uL  Basic metabolic panel     Status: Abnormal   Collection Time: 09/16/21 11:11 PM  Result Value Ref Range   Sodium 141 135 - 145 mmol/L   Potassium 3.3 (L) 3.5 - 5.1 mmol/L   Chloride 108 98 - 111 mmol/L   CO2 24 22 - 32 mmol/L   Glucose, Bld 100 (H) 70 - 99 mg/dL   BUN 8 6 - 20 mg/dL   Creatinine, Ser 5.36 0.44 - 1.00 mg/dL   Calcium 8.7 (L) 8.9 - 10.3 mg/dL   GFR, Estimated >14 >43  mL/min   Anion gap 9 5 - 15  hCG, serum, qualitative     Status: None   Collection Time: 09/16/21 11:11 PM  Result Value Ref Range   Preg, Serum NEGATIVE NEGATIVE   Imaging Studies: No results found.  ED COURSE and MDM  Nursing notes, initial and subsequent vitals signs, including pulse oximetry, reviewed and interpreted by myself.  Vitals:   09/16/21 2217 09/16/21 2221 09/16/21 2345 09/17/21 0030  BP:  (!) 129/98 116/71 115/78  Pulse:  95 69 64  Resp:  20 20 15   Temp:  98 F (36.7 C)    TempSrc:  Oral    SpO2:  99% 100% 100%  Weight: 52.2 kg     Height: 5\' 10"  (1.778 m)      Medications  sodium chloride 0.9 % bolus 1,000 mL (0 mLs Intravenous Stopped 09/17/21 0019)  diphenhydrAMINE (BENADRYL) injection 25 mg (25 mg Intravenous Given 09/16/21 2316)  metoCLOPramide (REGLAN) injection 10 mg (10 mg Intravenous Given 09/16/21 2316)  ketorolac (TORADOL) 15 MG/ML injection 15 mg (15 mg Intravenous Given 09/16/21 2316)   1:07 AM Patient feeling better after IV medications and fluids.  States she is ready to go home.  She is drinking fluids without vomiting.  It is unclear if this was just a hangover or if the alcohol consumption triggered a migraine.  Either way she has improved with treatment.   PROCEDURES  Procedures   ED DIAGNOSES     ICD-10-CM   1. Bad headache  R51.9     2. Nausea and vomiting in adult  R11.2          [name], 09/18/21, MD 09/17/21 0110

## 2021-09-16 NOTE — ED Triage Notes (Signed)
Pt sts she was drinking earlier and now has HA and vomiting; unable to keep down liquids

## 2021-09-17 NOTE — ED Notes (Signed)
Pt tolerating PO intake at this time, EDP made aware.

## 2021-09-17 NOTE — ED Notes (Signed)
Pt reports improvement in sx. Pt provided apple juice and saltine crackers for PO challenge.

## 2022-06-09 IMAGING — DX DG CHEST 1V PORT
1 series · 1 of 1 positions shown · non-contrast
Comparison: None.

CLINICAL DATA: Left chest pain. Body aches. Cough. Positive COVID
on 02/15/2020

EXAM:
PORTABLE CHEST 1 VIEW

[chest ap]
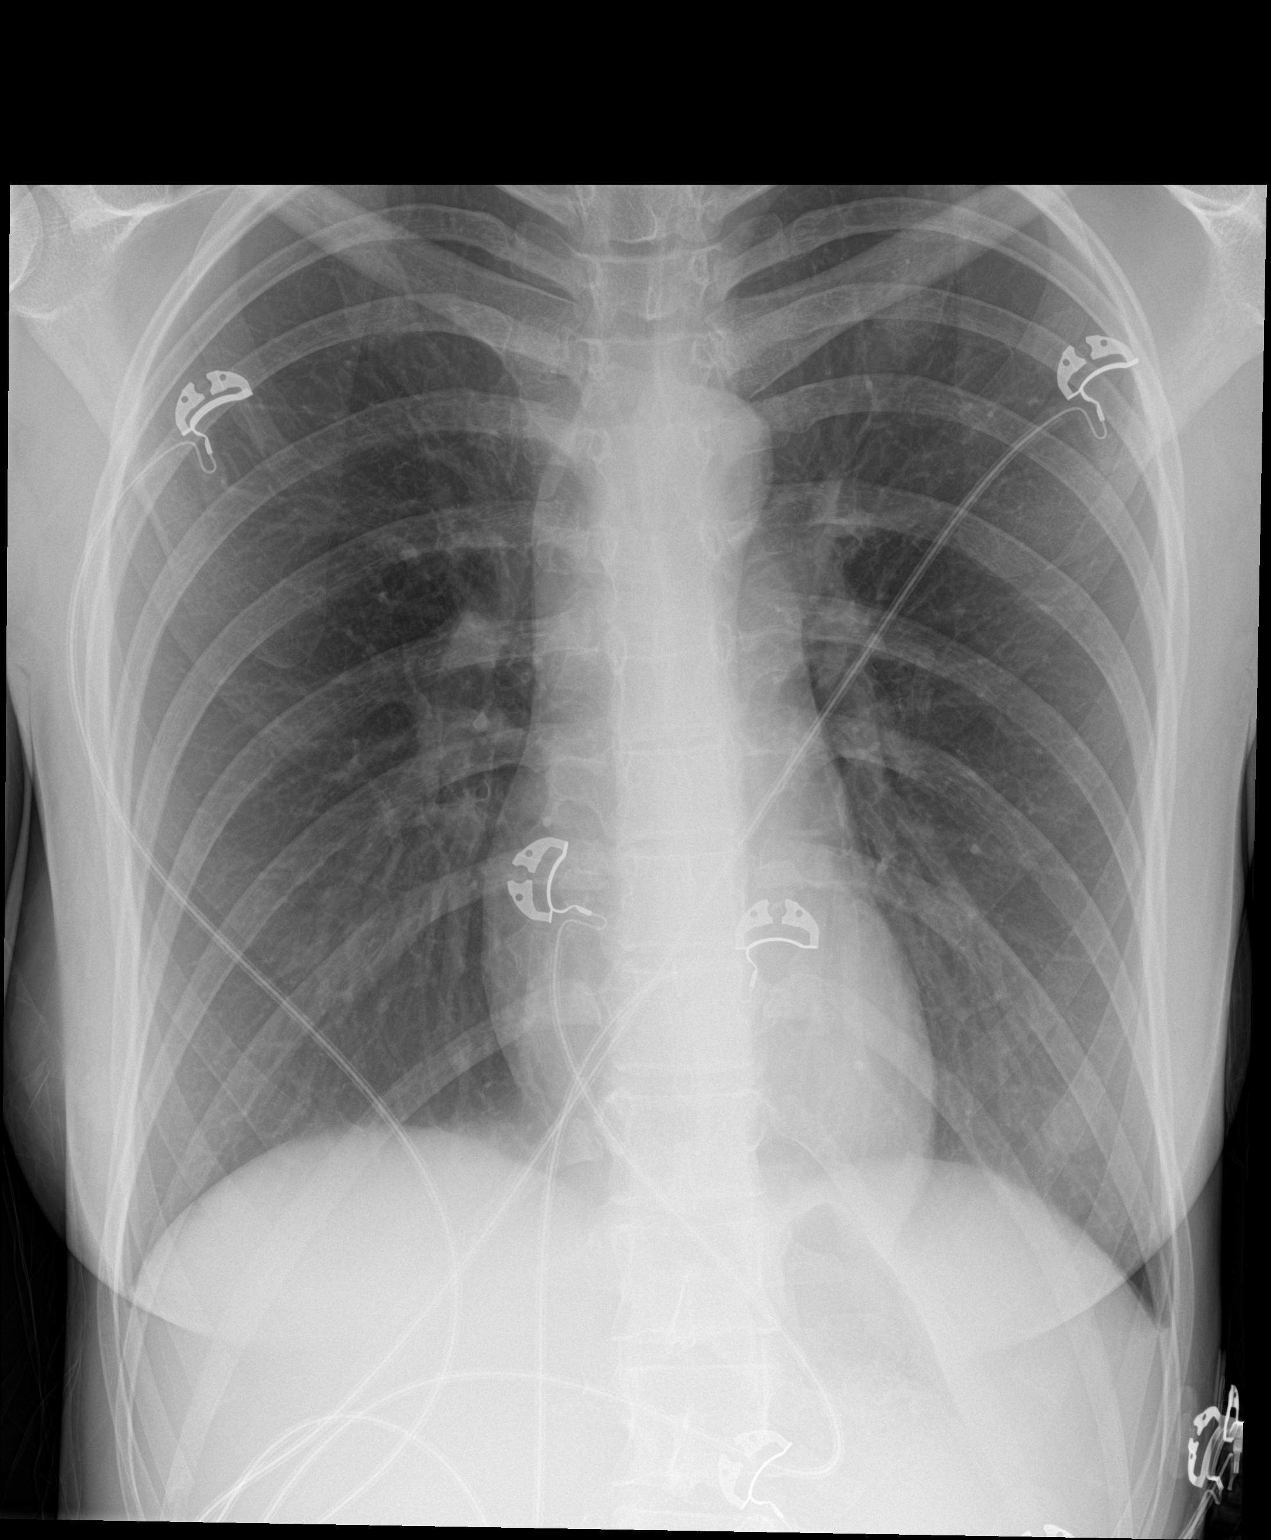

[1 of 1 positions shown; findings below may reference images not displayed]

FINDINGS: The heart size and mediastinal contours are within normal limits.

Round density overlying the left lower lobe suggestive of a nipple
shadow. Trace biapical pleural/pulmonary scarring. Large inspiratory
effort. No focal consolidation. No pulmonary edema. No pleural
effusion. No pneumothorax.

No acute osseous abnormality.
IMPRESSION: No active disease.
